# Patient Record
Sex: Female | Born: 2000 | Race: Black or African American | Hispanic: No | Marital: Single | Smoking: Never smoker
Health system: Southern US, Community
[De-identification: ages and names within clinical notes are randomized; demographics above are authoritative.]

## PROBLEM LIST (undated history)

## (undated) DIAGNOSIS — D649 Anemia, unspecified: Secondary | ICD-10-CM

## (undated) DIAGNOSIS — T7840XA Allergy, unspecified, initial encounter: Secondary | ICD-10-CM

## (undated) HISTORY — DX: Anemia, unspecified: D64.9

## (undated) HISTORY — DX: Allergy, unspecified, initial encounter: T78.40XA

---
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
## 2023-02-20 IMAGING — US US BREAST*R* LIMITED INC AXILLA
1 series · 4 of 4 positions shown · non-contrast
Comparison: None.

CLINICAL DATA: Patient noted lump in the UPPER-OUTER QUADRANT of
the RIGHT breast. Provider confirms mass in the 12 o'clock location
of the RIGHT breast.

EXAM:
ULTRASOUND OF THE RIGHT BREAST

[Series 1: us breast*right* limited inc axilla · 0.09mm/px · 4 of 4 slices shown]
[im 1/4]
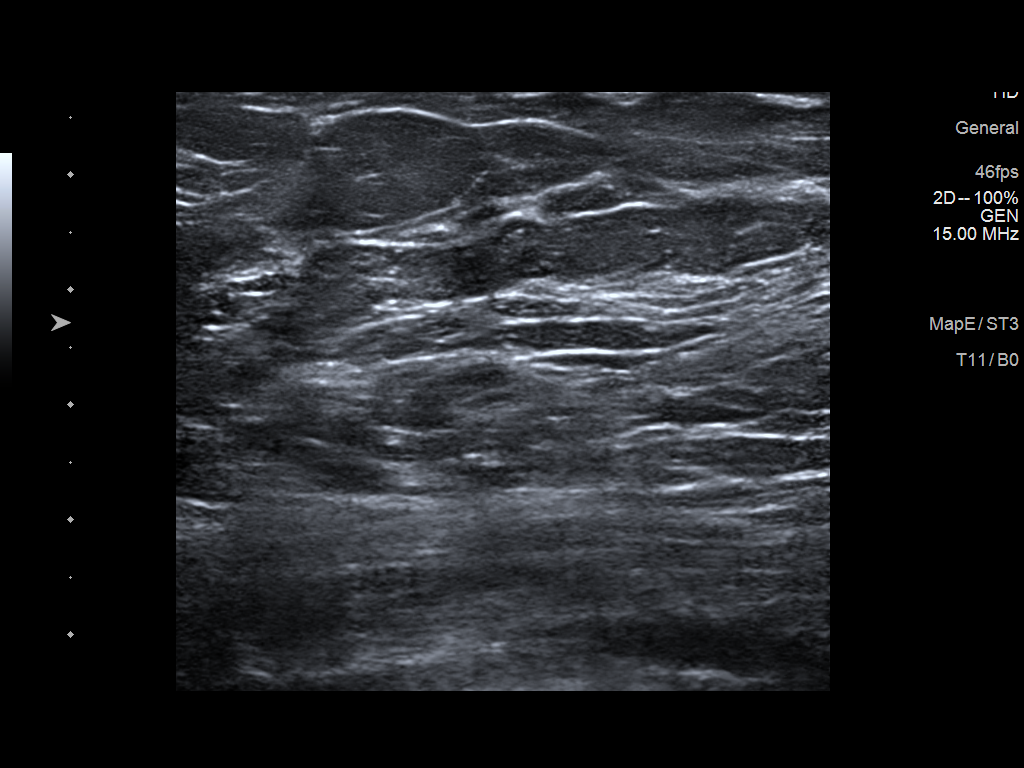
[im 2/4]
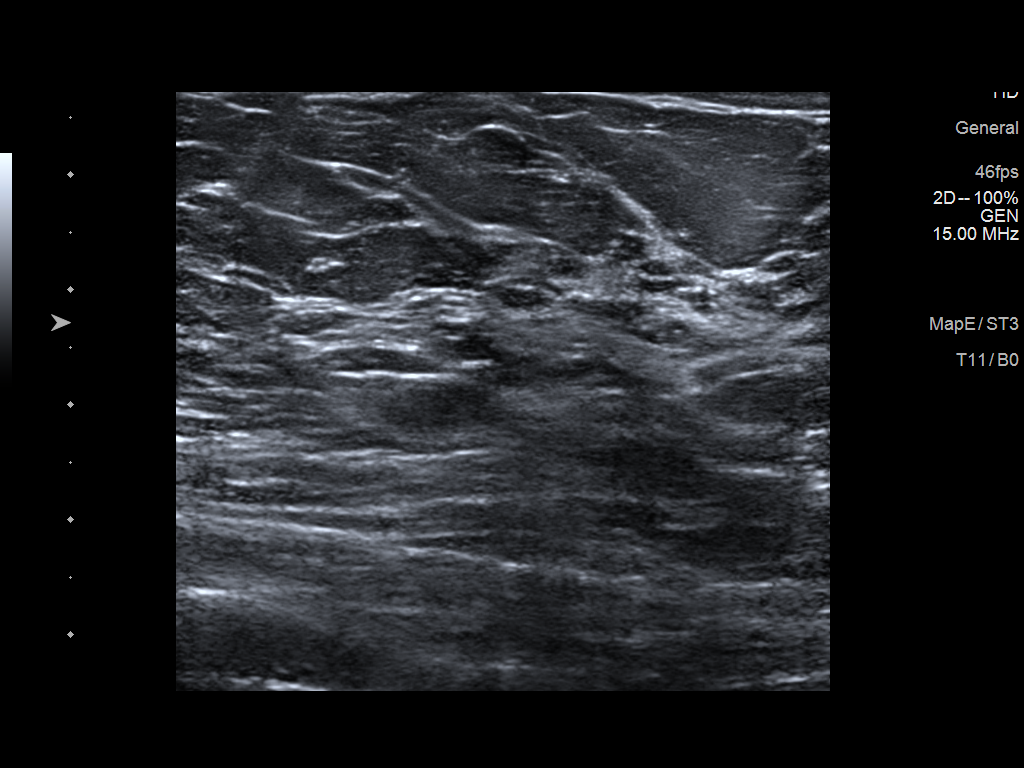
[im 3/4]
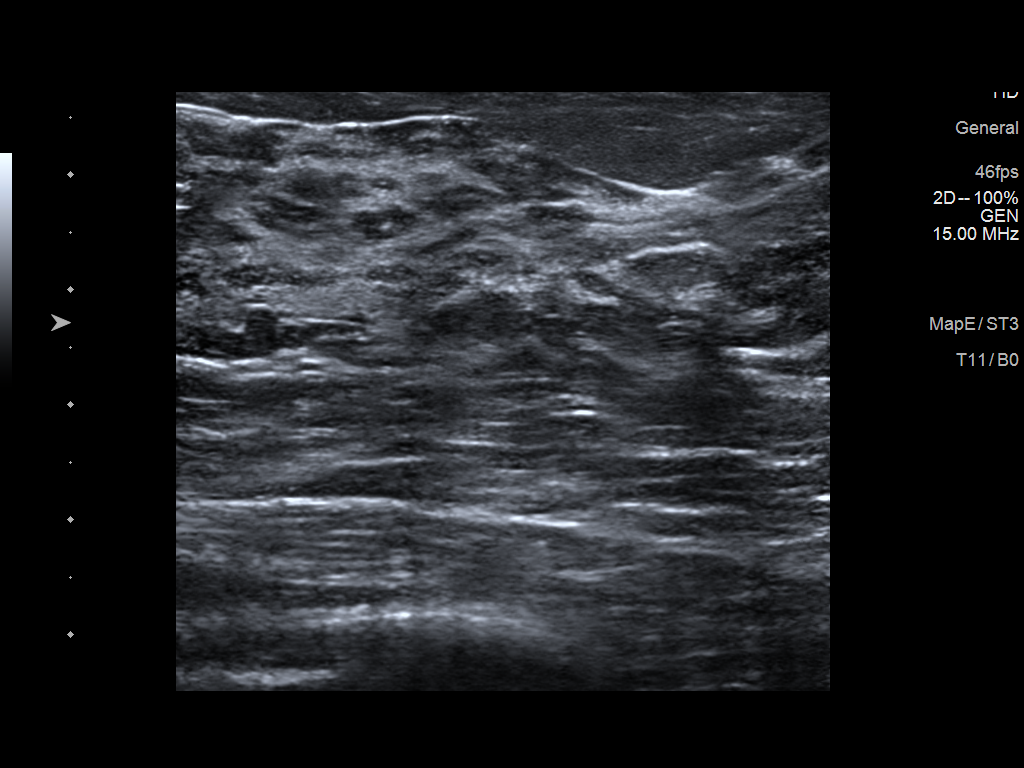
[im 4/4]
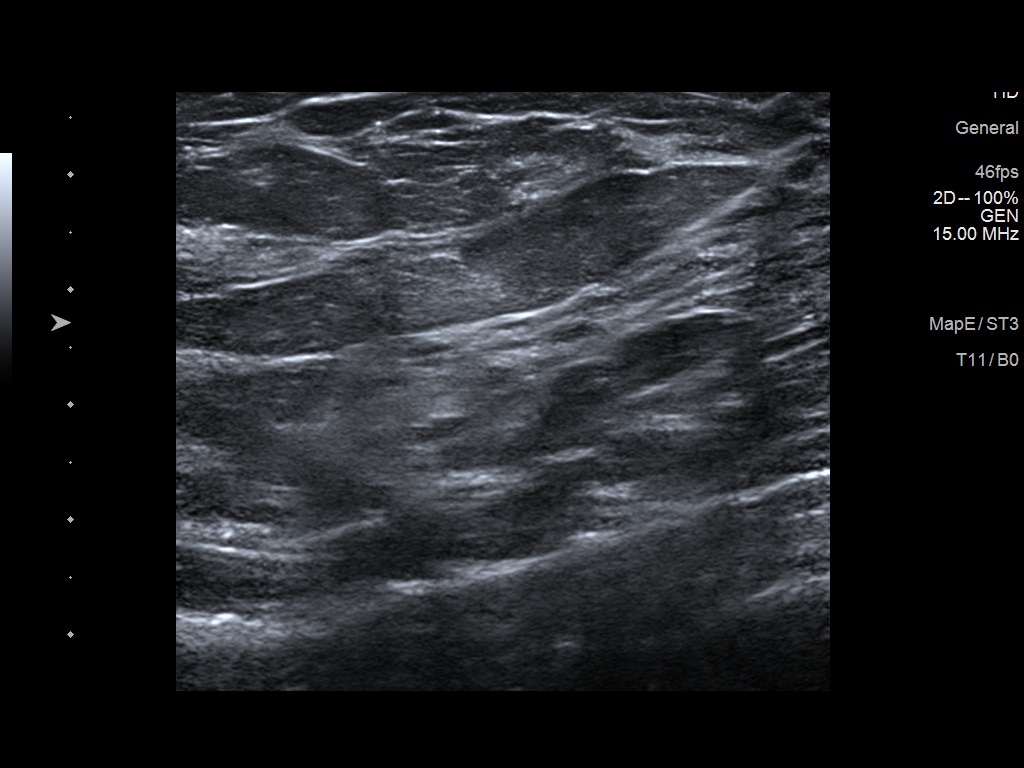

[4 of 4 positions shown; findings below may reference images not displayed]

FINDINGS: On physical exam, I palpate soft non focal thickening in the
UPPER-OUTER QUADRANT of the RIGHT breast.

Targeted ultrasound is performed, showing normal appearing
fibroglandular tissue from the 10 o'clock through the 12 o'clock
location of the RIGHT breast. No suspicious mass, distortion, or
acoustic shadowing is demonstrated with ultrasound.
IMPRESSION: There is no sonographic evidence for malignancy.

RECOMMENDATION:
Continued breast off exams.

Screening mammogram at age 42 unless there are persistent or
intervening clinical concerns. (Code:UR-3-HOK)

I have discussed the findings and recommendations with the patient
and her mother by Face Time. If applicable, a reminder letter will
be sent to the patient regarding the next appointment.

BI-RADS CATEGORY  1: Negative.
